# Patient Record
Sex: Female | Born: 1950 | Race: White | Hispanic: No | Marital: Married | Smoking: Never smoker
Health system: Southern US, Community
[De-identification: ages and names within clinical notes are randomized; demographics above are authoritative.]

## PROBLEM LIST (undated history)

## (undated) DIAGNOSIS — F329 Major depressive disorder, single episode, unspecified: Secondary | ICD-10-CM

## (undated) DIAGNOSIS — Z872 Personal history of diseases of the skin and subcutaneous tissue: Secondary | ICD-10-CM

## (undated) DIAGNOSIS — M797 Fibromyalgia: Secondary | ICD-10-CM

## (undated) DIAGNOSIS — I10 Essential (primary) hypertension: Secondary | ICD-10-CM

## (undated) DIAGNOSIS — K219 Gastro-esophageal reflux disease without esophagitis: Secondary | ICD-10-CM

## (undated) DIAGNOSIS — F32A Depression, unspecified: Secondary | ICD-10-CM

## (undated) DIAGNOSIS — M1990 Unspecified osteoarthritis, unspecified site: Secondary | ICD-10-CM

## (undated) DIAGNOSIS — Z8614 Personal history of Methicillin resistant Staphylococcus aureus infection: Secondary | ICD-10-CM

## (undated) DIAGNOSIS — Z87442 Personal history of urinary calculi: Secondary | ICD-10-CM

## (undated) DIAGNOSIS — M75100 Unspecified rotator cuff tear or rupture of unspecified shoulder, not specified as traumatic: Secondary | ICD-10-CM

## (undated) DIAGNOSIS — K5900 Constipation, unspecified: Secondary | ICD-10-CM

---

## 1984-02-15 HISTORY — PX: EXTERNAL EAR SURGERY: SHX627

## 1988-02-15 HISTORY — PX: ABDOMINAL HYSTERECTOMY: SHX81

## 2004-02-15 HISTORY — PX: JOINT REPLACEMENT: SHX530

## 2004-12-29 ENCOUNTER — Inpatient Hospital Stay (HOSPITAL_COMMUNITY): Admission: RE | Admit: 2004-12-29 | Discharge: 2005-01-01 | Payer: Self-pay | Admitting: Orthopedic Surgery

## 2005-02-14 HISTORY — PX: GASTRIC BYPASS: SHX52

## 2006-01-21 ENCOUNTER — Emergency Department: Payer: Self-pay | Admitting: Emergency Medicine

## 2007-05-16 ENCOUNTER — Ambulatory Visit: Payer: Self-pay | Admitting: Family Medicine

## 2008-08-19 ENCOUNTER — Ambulatory Visit: Payer: Self-pay | Admitting: Otolaryngology

## 2009-04-16 ENCOUNTER — Ambulatory Visit: Payer: Self-pay | Admitting: Family Medicine

## 2010-09-09 ENCOUNTER — Ambulatory Visit: Payer: Self-pay | Admitting: Family Medicine

## 2011-07-12 ENCOUNTER — Ambulatory Visit: Payer: Self-pay | Admitting: Family Medicine

## 2011-09-12 ENCOUNTER — Ambulatory Visit: Payer: Self-pay | Admitting: Family Medicine

## 2011-12-05 ENCOUNTER — Ambulatory Visit: Payer: Self-pay | Admitting: Gastroenterology

## 2012-09-13 ENCOUNTER — Other Ambulatory Visit: Payer: Self-pay | Admitting: Unknown Physician Specialty

## 2012-09-14 DIAGNOSIS — Z8614 Personal history of Methicillin resistant Staphylococcus aureus infection: Secondary | ICD-10-CM

## 2012-09-14 HISTORY — DX: Personal history of Methicillin resistant Staphylococcus aureus infection: Z86.14

## 2012-11-13 ENCOUNTER — Ambulatory Visit: Payer: Self-pay | Admitting: Family Medicine

## 2013-06-07 ENCOUNTER — Other Ambulatory Visit: Payer: Self-pay | Admitting: Orthopedic Surgery

## 2013-06-28 ENCOUNTER — Encounter (HOSPITAL_COMMUNITY): Payer: Self-pay | Admitting: Pharmacy Technician

## 2013-07-02 NOTE — Patient Instructions (Addendum)
Shelby MiloDeborah Marshall  07/02/2013                           YOUR PROCEDURE IS SCHEDULED ON: 07/11/13 AT 7:30 AM               ENTER THRU [facility] ENTRANCE AND                            FOLLOW  SIGNS TO SHORT STAY CENTER                 ARRIVE AT SHORT STAY AT:  5:30 AM               CALL THIS NUMBER IF ANY PROBLEMS THE DAY OF SURGERY :               [phone]                                REMEMBER:   Do not eat food or drink liquids AFTER MIDNIGHT               Take these medicines the morning of surgery with               A SIPS OF WATER :  PERCOCET IF NEEDED       Do not wear jewelry, make-up   Do not wear lotions, powders, or perfumes.   Do not shave legs or underarms 12 hrs. before surgery (men may shave face)  Do not bring valuables to the hospital.  Contacts, dentures or bridgework may not be worn into surgery.  Leave suitcase in the car. After surgery it may be brought to your room.  For patients admitted to the hospital more than one night, checkout time is            11:00 AM                                                       The day of discharge.   Patients discharged the day of surgery will not be allowed to drive home.            If going home same day of surgery, must have someone stay with you              FIRST 24 hrs at home and arrange for some one to drive you              home from hospital.   ________________________________________________________________________                                                                        [place] [facility] - PREPARING FOR SURGERY  Before surgery, you can play an important role.  Because skin is not sterile, your skin needs to be as free of germs as possible.  You can reduce the number of germs on your skin  by washing with CHG (chlorahexidine gluconate) soap before surgery.  CHG is an antiseptic cleaner which kills germs and bonds with the skin to continue killing germs even after  washing. Please DO NOT use if you have an allergy to CHG or antibacterial soaps.  If your skin becomes reddened/irritated stop using the CHG and inform your nurse when you arrive at Short Stay. Do not shave (including legs and underarms) for at least 48 hours prior to the first CHG shower.  You may shave your face. Please follow these instructions carefully:   1.  Shower with CHG Soap the night before surgery and the  morning of Surgery.   2.  If you choose to wash your hair, wash your hair first as usual with your  normal  Shampoo.   3.  After you shampoo, rinse your hair and body thoroughly to remove the  shampoo.                                         4.  Use CHG as you would any other liquid soap.  You can apply chg directly  to the skin and wash . Gently wash with scrungie or clean wascloth    5.  Apply the CHG Soap to your body ONLY FROM THE NECK DOWN.   Do not use on open                           Wound or open sores. Avoid contact with eyes, ears mouth and genitals (private parts).                        Genitals (private parts) with your normal soap.              6.  Wash thoroughly, paying special attention to the area where your surgery  will be performed.   7.  Thoroughly rinse your body with warm water from the neck down.   8.  DO NOT shower/wash with your normal soap after using and rinsing off  the CHG Soap .                9.  Pat yourself dry with a clean towel.             10.  Wear clean pajamas.             11.  Place clean sheets on your bed the night of your first shower and do not  sleep with pets.  Day of Surgery : Do not apply any lotions/deodorants the morning of surgery.  Please wear clean clothes to the hospital/surgery center.  FAILURE TO FOLLOW THESE INSTRUCTIONS MAY RESULT IN THE CANCELLATION OF YOUR SURGERY    PATIENT SIGNATURE_________________________________     Incentive Spirometer  An incentive spirometer is a tool that can help keep  your lungs clear and active. This tool measures how well you are filling your lungs with each breath. Taking long deep breaths may help reverse or decrease the chance of developing breathing (pulmonary) problems (especially infection) following:  A long period of time when you are unable to move or be active. BEFORE THE PROCEDURE   If the spirometer includes an indicator to show your best effort, your nurse or respiratory therapist will set it to a desired goal.  If  possible, sit up straight or lean slightly forward. Try not to slouch.  Hold the incentive spirometer in an upright position. INSTRUCTIONS FOR USE  1. Sit on the edge of your bed if possible, or sit up as far as you can in bed or on a chair. 2. Hold the incentive spirometer in an upright position. 3. Breathe out normally. 4. Place the mouthpiece in your mouth and seal your lips tightly around it. 5. Breathe in slowly and as deeply as possible, raising the piston or the ball toward the top of the column. 6. Hold your breath for 3-5 seconds or for as long as possible. Allow the piston or ball to fall to the bottom of the column. 7. Remove the mouthpiece from your mouth and breathe out normally. 8. Rest for a few seconds and repeat Steps 1 through 7 at least 10 times every 1-2 hours when you are awake. Take your time and take a few normal breaths between deep breaths. 9. The spirometer may include an indicator to show your best effort. Use the indicator as a goal to work toward during each repetition. 10. After each set of 10 deep breaths, practice coughing to be sure your lungs are clear. If you have an incision (the cut made at the time of surgery), support your incision when coughing by placing a pillow or rolled up towels firmly against it. Once you are able to get out of bed, walk around indoors and cough well. You may stop using the incentive spirometer when instructed by your caregiver.  RISKS AND COMPLICATIONS  Take your time  so you do not get dizzy or light-headed.  If you are in pain, you may need to take or ask for pain medication before doing incentive spirometry. It is harder to take a deep breath if you are having pain. AFTER USE  Rest and breathe slowly and easily.  It can be helpful to keep track of a log of your progress. Your caregiver can provide you with a simple table to help with this. If you are using the spirometer at home, follow these instructions: SEEK MEDICAL CARE IF:   You are having difficultly using the spirometer.  You have trouble using the spirometer as often as instructed.  Your pain medication is not giving enough relief while using the spirometer.  You develop fever of 100.5 F (38.1 C) or higher. SEEK IMMEDIATE MEDICAL CARE IF:   You cough up bloody sputum that had not been present before.  You develop fever of 102 F (38.9 C) or greater.  You develop worsening pain at or near the incision site. MAKE SURE YOU:   Understand these instructions.  Will watch your condition.  Will get help right away if you are not doing well or get worse. Document Released: 06/13/2006 Document Revised: 04/25/2011 Document Reviewed: 08/14/2006 [facility] Patient Information 2014 [place], [place].   ________________________________________________________________________

## 2013-07-03 ENCOUNTER — Other Ambulatory Visit: Payer: Self-pay | Admitting: Orthopedic Surgery

## 2013-07-03 ENCOUNTER — Encounter (HOSPITAL_COMMUNITY): Payer: Self-pay

## 2013-07-03 ENCOUNTER — Encounter (HOSPITAL_COMMUNITY)
Admission: RE | Admit: 2013-07-03 | Discharge: 2013-07-03 | Disposition: A | Discharge status: Home / Self Care | Payer: BC Managed Care – PPO | Source: Ambulatory Visit | Attending: Specialist | Admitting: Specialist

## 2013-07-03 ENCOUNTER — Ambulatory Visit (HOSPITAL_COMMUNITY)
Admission: RE | Admit: 2013-07-03 | Discharge: 2013-07-03 | Disposition: A | Discharge status: Home / Self Care | Payer: BC Managed Care – PPO | Source: Ambulatory Visit | Attending: Anesthesiology | Admitting: Anesthesiology

## 2013-07-03 DIAGNOSIS — K219 Gastro-esophageal reflux disease without esophagitis: Secondary | ICD-10-CM | POA: Insufficient documentation

## 2013-07-03 DIAGNOSIS — I10 Essential (primary) hypertension: Secondary | ICD-10-CM | POA: Insufficient documentation

## 2013-07-03 DIAGNOSIS — IMO0001 Reserved for inherently not codable concepts without codable children: Secondary | ICD-10-CM | POA: Insufficient documentation

## 2013-07-03 DIAGNOSIS — Z01818 Encounter for other preprocedural examination: Secondary | ICD-10-CM | POA: Insufficient documentation

## 2013-07-03 DIAGNOSIS — Z01812 Encounter for preprocedural laboratory examination: Secondary | ICD-10-CM | POA: Insufficient documentation

## 2013-07-03 DIAGNOSIS — Z87442 Personal history of urinary calculi: Secondary | ICD-10-CM | POA: Insufficient documentation

## 2013-07-03 DIAGNOSIS — Z01810 Encounter for preprocedural cardiovascular examination: Secondary | ICD-10-CM | POA: Insufficient documentation

## 2013-07-03 DIAGNOSIS — R918 Other nonspecific abnormal finding of lung field: Secondary | ICD-10-CM | POA: Insufficient documentation

## 2013-07-03 HISTORY — DX: Personal history of Methicillin resistant Staphylococcus aureus infection: Z86.14

## 2013-07-03 HISTORY — DX: Fibromyalgia: M79.7

## 2013-07-03 HISTORY — DX: Personal history of urinary calculi: Z87.442

## 2013-07-03 HISTORY — DX: Personal history of diseases of the skin and subcutaneous tissue: Z87.2

## 2013-07-03 HISTORY — DX: Essential (primary) hypertension: I10

## 2013-07-03 HISTORY — DX: Unspecified rotator cuff tear or rupture of unspecified shoulder, not specified as traumatic: M75.100

## 2013-07-03 HISTORY — DX: Unspecified osteoarthritis, unspecified site: M19.90

## 2013-07-03 HISTORY — DX: Constipation, unspecified: K59.00

## 2013-07-03 HISTORY — DX: Major depressive disorder, single episode, unspecified: F32.9

## 2013-07-03 HISTORY — DX: Gastro-esophageal reflux disease without esophagitis: K21.9

## 2013-07-03 HISTORY — DX: Depression, unspecified: F32.A

## 2013-07-03 LAB — BASIC METABOLIC PANEL
BUN: 28 mg/dL — ABNORMAL HIGH (ref 6–23)
CO2: 26 mEq/L (ref 19–32)
Calcium: 9.6 mg/dL (ref 8.4–10.5)
Chloride: 99 mEq/L (ref 96–112)
Creatinine, Ser: 0.8 mg/dL (ref 0.50–1.10)
GFR calc Af Amer: >90 mL/min (ref >90)
GFR calc non Af Amer: 79 mL/min — ABNORMAL LOW (ref >90)
Glucose, Bld: 97 mg/dL (ref 70–99)
Potassium: 4.3 mEq/L (ref 3.7–5.3)
Sodium: 138 mEq/L (ref 137–147)

## 2013-07-03 LAB — CBC
HCT: 37.2 % (ref 36.0–46.0)
Hemoglobin: 12.5 g/dL (ref 12.0–15.0)
MCH: 31.6 pg (ref 26.0–34.0)
MCHC: 33.6 g/dL (ref 30.0–36.0)
MCV: 93.9 fL (ref 78.0–100.0)
Platelets: 334 10*3/uL (ref 150–400)
RBC: 3.96 MIL/uL (ref 3.87–5.11)
RDW: 12.7 % (ref 11.5–15.5)
WBC: 7.5 10*3/uL (ref 4.0–10.5)

## 2013-07-03 NOTE — H&P (Signed)
Shelby Marshall is an 63 y.o. female.   Chief Complaint: R shoulder pain HPI: The patient is a 63 year old female who presents today for follow up of their shoulder. The patient is being followed for their right shoulder pain. They are 8 month(s) out from when symptoms began. Symptoms reported today include: pain. and report their pain level to be mild (to severe. The pain is worse at night). Current treatment includes: home exercise program, NSAIDs (Arthrotec) and heat. The patient presents today following MRI. The patient has reported improvement of their symptoms with: Cortisone injections (helped for one month).  Past Medical History  Diagnosis Date  . Hypertension   . Rotator cuff tear     RIGHT  . Fibromyalgia   . Arthritis   . Hx of psoriasis   . GERD (gastroesophageal reflux disease)   . Constipation   . History of kidney stones   . Depression   . History of MRSA infection 09/2012    Past Surgical History  Procedure Laterality Date  . Joint replacement  2006    L TOTAL HIP   . Gastric bypass  2007  . Abdominal hysterectomy  1990  . External ear surgery  1986    DEVELOPED STAPH INFECTION AFTER SURG    No family history on file. Social History:  reports that she has never smoked. She does not have any smokeless tobacco history on file. She reports that she drinks alcohol. She reports that she does not use illicit drugs.  Allergies:  Allergies  Allergen Reactions  . Morphine And Related Other (See Comments)    No results from pain     (Not in a hospital admission)  Results for orders placed during the hospital encounter of 07/03/13 (from the past 48 hour(s))  CBC     Status: None   Collection Time    07/03/13  3:10 PM      Result Value Ref Range   WBC 7.5  4.0 - 10.5 K/uL   RBC 3.96  3.87 - 5.11 MIL/uL   Hemoglobin 12.5  12.0 - 15.0 g/dL   HCT 37.2  36.0 - 46.0 %   MCV 93.9  78.0 - 100.0 fL   MCH 31.6  26.0 - 34.0 pg   MCHC 33.6  30.0 - 36.0 g/dL   RDW 12.7   11.5 - 15.5 %   Platelets 334  150 - 400 K/uL  BASIC METABOLIC PANEL     Status: Abnormal   Collection Time    07/03/13  3:10 PM      Result Value Ref Range   Sodium 138  137 - 147 mEq/L   Potassium 4.3  3.7 - 5.3 mEq/L   Chloride 99  96 - 112 mEq/L   CO2 26  19 - 32 mEq/L   Glucose, Bld 97  70 - 99 mg/dL   BUN 28 (*) 6 - 23 mg/dL   Creatinine, Ser 0.80  0.50 - 1.10 mg/dL   Calcium 9.6  8.4 - 10.5 mg/dL   GFR calc non Af Amer 79 (*) >90 mL/min   GFR calc Af Amer >90  >90 mL/min   Comment: (NOTE)     The eGFR has been calculated using the CKD EPI equation.     This calculation has not been validated in all clinical situations.     eGFR's persistently <90 mL/min signify possible Chronic Kidney     Disease.   Dg Chest 2 View  07/03/2013  CLINICAL DATA:  Preoperative evaluation for RIGHT rotator cuff repair, history hypertension, fibromyalgia, GERD, kidney stones  EXAM: CHEST  2 VIEW  COMPARISON:  12/28/2004  FINDINGS: Normal heart size, mediastinal contours, and pulmonary vascularity.  Minimal chronic peribronchial thickening.  Lungs otherwise clear.  No pleural effusion or pneumothorax.  Bones unremarkable.  IMPRESSION: Minimal chronic bronchitic changes.  No acute abnormalities.   Electronically Signed   By: Lavonia Dana M.D.   On: 07/03/2013 17:03    Review of Systems  Constitutional: Negative.   HENT: Negative.   Eyes: Negative.   Respiratory: Negative.   Cardiovascular: Negative.   Gastrointestinal: Negative.   Genitourinary: Negative.   Musculoskeletal: Positive for joint pain.  Skin: Negative.   Neurological: Negative.   Psychiatric/Behavioral: Negative.     There were no vitals taken for this visit. Physical Exam  Constitutional: She is oriented to person, place, and time. She appears well-developed and well-nourished.  HENT:  Head: Normocephalic and atraumatic.  Eyes: Conjunctivae and EOM are normal. Pupils are equal, round, and reactive to light.  Neck: Normal  range of motion. Neck supple.  Cardiovascular: Normal rate and regular rhythm.   Respiratory: Effort normal and breath sounds normal.  GI: Soft. Bowel sounds are normal.  Musculoskeletal:  On exam positive impingement signs, weakness with external rotation. Tender in the anterior subacromial region.  Inspection of the shoulder revealed no ecchymosis, soft tissue swelling, or deformity. On palpation, nontender over the [facility] region. On range of motion the patient had full range of motion. Provocative signs indicated no sulcus sign, negative speed's test. Negative lift off. Sensory exam was intact and motor function was normal in the deltoid and the rotator cuff.  Neurological: She is alert and oriented to person, place, and time. She has normal reflexes.  Skin: Skin is warm and dry.  Psychiatric: She has a normal mood and affect.     The patient has full thickness tear of the supraspinatus and infraspinatus 2 centimeters with 1 centimeter retraction, atrophy of the infraspinatus, and mild AC arthrosis. Interstitial tearing of the subscap.   Assessment/Plan R shoulder RCT Full thickness rotator cuff tear, retracted, mild atrophy of the infraspinatus.  I had a long discussion with the patient concerning the risks and benefits of the proposed shoulder surgery including need for rotator cuff repair, infection, suboptimal range of motion, adhesive capsulitis, and recurrent tear requiring further surgery. We also discussed the extended recovery requirement for postoperative physical therapy and the time to maximum recovery. I provided the patient with an illustrated handout and discussed that in detail. We also discussed anesthetic complications, DVT, PE, cardiopulmonary dysfunction, etc.   had a long discussion with the patient concerning the risks and benefits of a rotator cuff repair, including bleeding, infection, prolonged postoperative recovery, which may require 3 to 5 months until maximum  medical improvement. Overight procedure with initiation of early passive range of motion within physical therapy. Avoid any active motion for the first six weeks. This is all in an effort to avoid recurrent tear of the rotator cuff and adhesive capsulitis. Return to work without use of the arm can be obtained following two weeks. However, driving will be a challenge. We also discussed the possibility of requiring implants including bone anchors,as well as an Allograft patch graft if a massive rotator cuff tear is encountered. Removal of any bones for spurs as well as bursitis will be performed during the procedure and also any associated anesthetic complications as well.  Prescription for  Norco. History of MRSA exposure. She will require Vancomycin. Discussed time out of work and to recovery. She works at [employer].  Plan R shoulder mini-open RCR, SAD  Asta Corbridge M. Javonne Dorko PA-C for Dr. Tonita Cong 07/03/2013, 8:47 PM

## 2013-07-10 ENCOUNTER — Encounter (HOSPITAL_COMMUNITY): Payer: Self-pay | Admitting: Anesthesiology

## 2013-07-10 NOTE — Anesthesia Preprocedure Evaluation (Addendum)
Anesthesia Evaluation  Patient identified by MRN, date of birth, ID band Patient awake    Reviewed: Allergy & Precautions, H&P , NPO status , Patient's Chart, lab work & pertinent test results  Airway Mallampati: II TM Distance: >3 FB Neck ROM: Full    Dental no notable dental hx.    Pulmonary neg pulmonary ROS,  breath sounds clear to auscultation  Pulmonary exam normal       Cardiovascular Exercise Tolerance: Good hypertension, Pt. on medications Rhythm:Regular Rate:Normal  ECG normal CXR bronchitic changes   Neuro/Psych PSYCHIATRIC DISORDERS Depression  Neuromuscular disease    GI/Hepatic Neg liver ROS, GERD-  ,  Endo/Other  negative endocrine ROS  Renal/GU negative Renal ROS  negative genitourinary   Musculoskeletal  (+) Fibromyalgia -, narcotic dependent  Abdominal   Peds negative pediatric ROS (+)  Hematology negative hematology ROS (+)   Anesthesia Other Findings   Reproductive/Obstetrics negative OB ROS                          Anesthesia Physical Anesthesia Plan  ASA: II  Anesthesia Plan: General   Post-op Pain Management:    Induction: Intravenous  Airway Management Planned: Oral ETT  Additional Equipment:   Intra-op Plan:   Post-operative Plan: Extubation in OR  Informed Consent: I have reviewed the patients History and Physical, chart, labs and discussed the procedure including the risks, benefits and alternatives for the proposed anesthesia with the patient or authorized representative who has indicated his/her understanding and acceptance.   Dental advisory given  Plan Discussed with: CRNA  Anesthesia Plan Comments:         Anesthesia Quick Evaluation

## 2013-07-11 ENCOUNTER — Ambulatory Visit (HOSPITAL_COMMUNITY)
Admission: RE | Admit: 2013-07-11 | Discharge: 2013-07-11 | Disposition: A | Discharge status: Home / Self Care | Payer: BC Managed Care – PPO | Source: Ambulatory Visit | Attending: Specialist | Admitting: Specialist

## 2013-07-11 ENCOUNTER — Ambulatory Visit (HOSPITAL_COMMUNITY): Payer: BC Managed Care – PPO | Admitting: Anesthesiology

## 2013-07-11 ENCOUNTER — Encounter (HOSPITAL_COMMUNITY): Payer: BC Managed Care – PPO | Admitting: Anesthesiology

## 2013-07-11 ENCOUNTER — Encounter (HOSPITAL_COMMUNITY): Payer: Self-pay | Admitting: *Deleted

## 2013-07-11 ENCOUNTER — Encounter (HOSPITAL_COMMUNITY)
Admission: RE | Disposition: A | Discharge status: Home / Self Care | Payer: Self-pay | Source: Ambulatory Visit | Attending: Specialist

## 2013-07-11 DIAGNOSIS — X58XXXA Exposure to other specified factors, initial encounter: Secondary | ICD-10-CM | POA: Insufficient documentation

## 2013-07-11 DIAGNOSIS — Z8614 Personal history of Methicillin resistant Staphylococcus aureus infection: Secondary | ICD-10-CM | POA: Insufficient documentation

## 2013-07-11 DIAGNOSIS — K219 Gastro-esophageal reflux disease without esophagitis: Secondary | ICD-10-CM | POA: Insufficient documentation

## 2013-07-11 DIAGNOSIS — Z9884 Bariatric surgery status: Secondary | ICD-10-CM | POA: Insufficient documentation

## 2013-07-11 DIAGNOSIS — S43429A Sprain of unspecified rotator cuff capsule, initial encounter: Secondary | ICD-10-CM | POA: Insufficient documentation

## 2013-07-11 DIAGNOSIS — Z96649 Presence of unspecified artificial hip joint: Secondary | ICD-10-CM | POA: Insufficient documentation

## 2013-07-11 DIAGNOSIS — M75101 Unspecified rotator cuff tear or rupture of right shoulder, not specified as traumatic: Secondary | ICD-10-CM

## 2013-07-11 DIAGNOSIS — IMO0001 Reserved for inherently not codable concepts without codable children: Secondary | ICD-10-CM | POA: Insufficient documentation

## 2013-07-11 DIAGNOSIS — Z90710 Acquired absence of both cervix and uterus: Secondary | ICD-10-CM | POA: Insufficient documentation

## 2013-07-11 DIAGNOSIS — I10 Essential (primary) hypertension: Secondary | ICD-10-CM | POA: Insufficient documentation

## 2013-07-11 HISTORY — PX: SHOULDER ARTHROSCOPY WITH ROTATOR CUFF REPAIR AND SUBACROMIAL DECOMPRESSION: SHX5686

## 2013-07-11 SURGERY — SHOULDER ARTHROSCOPY WITH ROTATOR CUFF REPAIR AND SUBACROMIAL DECOMPRESSION
Anesthesia: General | Site: Shoulder | Laterality: Right

## 2013-07-11 MED ORDER — ONDANSETRON HCL 4 MG/2ML IJ SOLN
INTRAMUSCULAR | Status: DC | PRN
  Administered 2013-07-11: 4 mg via INTRAVENOUS

## 2013-07-11 MED ORDER — GLYCOPYRROLATE 0.2 MG/ML IJ SOLN
INTRAMUSCULAR | Status: AC
  Filled 2013-07-11: qty 3

## 2013-07-11 MED ORDER — ACETAMINOPHEN 10 MG/ML IV SOLN
1000.0000 mg | Freq: Once | INTRAVENOUS | Status: AC
  Administered 2013-07-11: 1000 mg via INTRAVENOUS
  Filled 2013-07-11: qty 100

## 2013-07-11 MED ORDER — BUPIVACAINE-EPINEPHRINE 0.5% -1:200000 IJ SOLN
INTRAMUSCULAR | Status: DC | PRN
  Administered 2013-07-11: 27 mL

## 2013-07-11 MED ORDER — KETOROLAC TROMETHAMINE 10 MG PO TABS: 10.0000 mg | ORAL_TABLET | Freq: Four times a day (QID) | ORAL | Status: AC | PRN

## 2013-07-11 MED ORDER — PHENYLEPHRINE HCL 10 MG/ML IJ SOLN
INTRAMUSCULAR | Status: AC
  Filled 2013-07-11: qty 1

## 2013-07-11 MED ORDER — LIDOCAINE HCL (CARDIAC) 20 MG/ML IV SOLN
INTRAVENOUS | Status: DC | PRN
  Administered 2013-07-11: 80 mg via INTRAVENOUS

## 2013-07-11 MED ORDER — LACTATED RINGERS IR SOLN
Status: DC | PRN
  Administered 2013-07-11: 3000 mL

## 2013-07-11 MED ORDER — FENTANYL CITRATE 0.05 MG/ML IJ SOLN
INTRAMUSCULAR | Status: DC | PRN
  Administered 2013-07-11 (×2): 50 ug via INTRAVENOUS
  Administered 2013-07-11: 25 ug via INTRAVENOUS
  Administered 2013-07-11: 50 ug via INTRAVENOUS
  Administered 2013-07-11: 25 ug via INTRAVENOUS
  Administered 2013-07-11: 50 ug via INTRAVENOUS

## 2013-07-11 MED ORDER — ROCURONIUM BROMIDE 100 MG/10ML IV SOLN
INTRAVENOUS | Status: DC | PRN
Start: 2013-07-11 — End: 2013-07-11
  Administered 2013-07-11: 50 mg via INTRAVENOUS

## 2013-07-11 MED ORDER — CYCLOBENZAPRINE HCL 10 MG PO TABS
10.0000 mg | ORAL_TABLET | Freq: Once | ORAL | Status: AC
  Administered 2013-07-11: 10 mg via ORAL
  Filled 2013-07-11: qty 1

## 2013-07-11 MED ORDER — CYCLOBENZAPRINE HCL 10 MG PO TABS: 10.0000 mg | ORAL_TABLET | Freq: Three times a day (TID) | ORAL | Status: AC | PRN

## 2013-07-11 MED ORDER — EPINEPHRINE HCL 1 MG/ML IJ SOLN
INTRAMUSCULAR | Status: AC
  Filled 2013-07-11: qty 2

## 2013-07-11 MED ORDER — KETAMINE HCL 10 MG/ML IJ SOLN
INTRAMUSCULAR | Status: AC
  Filled 2013-07-11: qty 1

## 2013-07-11 MED ORDER — DOXYCYCLINE HYCLATE 100 MG PO CAPS: 100.0000 mg | ORAL_CAPSULE | Freq: Two times a day (BID) | ORAL | Status: AC

## 2013-07-11 MED ORDER — FENTANYL CITRATE 0.05 MG/ML IJ SOLN
25.0000 ug | INTRAMUSCULAR | Status: DC | PRN
  Administered 2013-07-11 (×2): 50 ug via INTRAVENOUS

## 2013-07-11 MED ORDER — LACTATED RINGERS IV SOLN
INTRAVENOUS | Status: DC
Start: 2013-07-11 — End: 2013-07-11

## 2013-07-11 MED ORDER — CEFAZOLIN SODIUM-DEXTROSE 2-3 GM-% IV SOLR
INTRAVENOUS | Status: AC
  Filled 2013-07-11: qty 50

## 2013-07-11 MED ORDER — NEOSTIGMINE METHYLSULFATE 10 MG/10ML IV SOLN
INTRAVENOUS | Status: AC
  Filled 2013-07-11: qty 1

## 2013-07-11 MED ORDER — FENTANYL CITRATE 0.05 MG/ML IJ SOLN
INTRAMUSCULAR | Status: AC
  Filled 2013-07-11: qty 2

## 2013-07-11 MED ORDER — ONDANSETRON HCL 4 MG/2ML IJ SOLN
INTRAMUSCULAR | Status: AC
  Filled 2013-07-11: qty 2

## 2013-07-11 MED ORDER — BUPIVACAINE-EPINEPHRINE (PF) 0.5% -1:200000 IJ SOLN
INTRAMUSCULAR | Status: AC
  Filled 2013-07-11: qty 30

## 2013-07-11 MED ORDER — MIDAZOLAM HCL 5 MG/5ML IJ SOLN
INTRAMUSCULAR | Status: DC | PRN
  Administered 2013-07-11: 2 mg via INTRAVENOUS

## 2013-07-11 MED ORDER — EPINEPHRINE HCL 1 MG/ML IJ SOLN
INTRAMUSCULAR | Status: DC | PRN
  Administered 2013-07-11: 1 mg

## 2013-07-11 MED ORDER — DEXAMETHASONE SODIUM PHOSPHATE 10 MG/ML IJ SOLN
INTRAMUSCULAR | Status: AC
  Filled 2013-07-11: qty 1

## 2013-07-11 MED ORDER — MIDAZOLAM HCL 2 MG/2ML IJ SOLN
INTRAMUSCULAR | Status: AC
  Filled 2013-07-11: qty 2

## 2013-07-11 MED ORDER — KETOROLAC TROMETHAMINE 10 MG PO TABS
10.0000 mg | ORAL_TABLET | Freq: Once | ORAL | Status: AC
  Administered 2013-07-11: 10 mg via ORAL
  Filled 2013-07-11: qty 1

## 2013-07-11 MED ORDER — VANCOMYCIN HCL IN DEXTROSE 1-5 GM/200ML-% IV SOLN
INTRAVENOUS | Status: AC
  Filled 2013-07-11: qty 200

## 2013-07-11 MED ORDER — LIDOCAINE HCL (CARDIAC) 20 MG/ML IV SOLN
INTRAVENOUS | Status: AC
  Filled 2013-07-11: qty 5

## 2013-07-11 MED ORDER — SODIUM CHLORIDE 0.9 % IR SOLN
Status: DC | PRN
  Administered 2013-07-11: 09:00:00

## 2013-07-11 MED ORDER — PROPOFOL 10 MG/ML IV BOLUS
INTRAVENOUS | Status: AC
  Filled 2013-07-11: qty 20

## 2013-07-11 MED ORDER — NEOSTIGMINE METHYLSULFATE 10 MG/10ML IV SOLN
INTRAVENOUS | Status: DC | PRN
  Administered 2013-07-11: 3 mg via INTRAVENOUS

## 2013-07-11 MED ORDER — CEFAZOLIN SODIUM-DEXTROSE 2-3 GM-% IV SOLR
2.0000 g | INTRAVENOUS | Status: AC
  Administered 2013-07-11: 2 g via INTRAVENOUS

## 2013-07-11 MED ORDER — DOCUSATE SODIUM 100 MG PO CAPS: 100.0000 mg | ORAL_CAPSULE | Freq: Two times a day (BID) | ORAL | Status: AC | PRN

## 2013-07-11 MED ORDER — DEXAMETHASONE SODIUM PHOSPHATE 10 MG/ML IJ SOLN
INTRAMUSCULAR | Status: DC | PRN
  Administered 2013-07-11: 10 mg via INTRAVENOUS

## 2013-07-11 MED ORDER — PROMETHAZINE HCL 25 MG/ML IJ SOLN: 6.2500 mg | INTRAMUSCULAR | Status: DC | PRN

## 2013-07-11 MED ORDER — LACTATED RINGERS IV SOLN
INTRAVENOUS | Status: DC | PRN
  Administered 2013-07-11 (×2): via INTRAVENOUS

## 2013-07-11 MED ORDER — VANCOMYCIN HCL IN DEXTROSE 1-5 GM/200ML-% IV SOLN
1000.0000 mg | INTRAVENOUS | Status: AC
  Administered 2013-07-11: 1000 mg via INTRAVENOUS

## 2013-07-11 MED ORDER — FENTANYL CITRATE 0.05 MG/ML IJ SOLN
INTRAMUSCULAR | Status: AC
  Filled 2013-07-11: qty 5

## 2013-07-11 MED ORDER — OXYCODONE-ACETAMINOPHEN 7.5-325 MG PO TABS: 1.0000 | ORAL_TABLET | ORAL | Status: AC | PRN

## 2013-07-11 MED ORDER — ETOMIDATE 2 MG/ML IV SOLN
INTRAVENOUS | Status: AC
  Filled 2013-07-11: qty 10

## 2013-07-11 MED ORDER — GLYCOPYRROLATE 0.2 MG/ML IJ SOLN
INTRAMUSCULAR | Status: DC | PRN
  Administered 2013-07-11: .4 mg via INTRAVENOUS

## 2013-07-11 MED ORDER — ROCURONIUM BROMIDE 100 MG/10ML IV SOLN
INTRAVENOUS | Status: AC
  Filled 2013-07-11: qty 1

## 2013-07-11 SURGICAL SUPPLY — 57 items
ANCH SUT PUSHLCK 24X4.5 STRL (Orthopedic Implant) ×2 IMPLANT
ANCH SUT SWLK 19.1X4.75 VT (Anchor) ×1 IMPLANT
ANCHOR NEEDLE 9/16 CIR SZ 8 (NEEDLE) IMPLANT
ANCHOR PEEK 4.75X19.1 SWLK C (Anchor) ×2 IMPLANT
APL SKNCLS STERI-STRIP NONHPOA (GAUZE/BANDAGES/DRESSINGS) ×1
BENZOIN TINCTURE PRP APPL 2/3 (GAUZE/BANDAGES/DRESSINGS) ×2 IMPLANT
BLADE CUDA SHAVER 3.5 (BLADE) ×2 IMPLANT
BLADE OSCILLATING/SAGITTAL (BLADE)
BLADE SURG SZ11 CARB STEEL (BLADE) ×2 IMPLANT
BLADE SW THK.38XMED LNG THN (BLADE) IMPLANT
BUR OVAL 4.0 (BURR) IMPLANT
CANNULA ACUFO 5X76 (CANNULA) ×2 IMPLANT
CHLORAPREP W/TINT 26ML (MISCELLANEOUS) IMPLANT
CLEANER TIP ELECTROSURG 2X2 (MISCELLANEOUS) ×2 IMPLANT
CLOTH 2% CHLOROHEXIDINE 3PK (PERSONAL CARE ITEMS) ×2 IMPLANT
DECANTER SPIKE VIAL GLASS SM (MISCELLANEOUS) ×2 IMPLANT
DRAPE ORTHO SPLIT 77X108 STRL (DRAPES) ×2
DRAPE POUCH INSTRU U-SHP 10X18 (DRAPES) IMPLANT
DRAPE STERI 35X30 U-POUCH (DRAPES) ×2 IMPLANT
DRAPE SURG ORHT 6 SPLT 77X108 (DRAPES) ×1 IMPLANT
DRSG AQUACEL AG ADV 3.5X 6 (GAUZE/BANDAGES/DRESSINGS) ×2 IMPLANT
DURAPREP 26ML APPLICATOR (WOUND CARE) ×2 IMPLANT
ELECT NEEDLE TIP 2.8 STRL (NEEDLE) IMPLANT
ELECT REM PT RETURN 9FT ADLT (ELECTROSURGICAL) ×2
ELECTRODE REM PT RTRN 9FT ADLT (ELECTROSURGICAL) ×1 IMPLANT
GLOVE BIOGEL PI IND STRL 7.5 (GLOVE) ×1 IMPLANT
GLOVE BIOGEL PI IND STRL 8 (GLOVE) ×1 IMPLANT
GLOVE BIOGEL PI INDICATOR 7.5 (GLOVE) ×1
GLOVE BIOGEL PI INDICATOR 8 (GLOVE) ×1
GLOVE SURG SS PI 7.5 STRL IVOR (GLOVE) ×2 IMPLANT
GLOVE SURG SS PI 8.0 STRL IVOR (GLOVE) ×4 IMPLANT
GOWN STRL REUS W/TWL XL LVL3 (GOWN DISPOSABLE) ×4 IMPLANT
KIT BASIN OR (CUSTOM PROCEDURE TRAY) ×2 IMPLANT
KIT POSITION SHOULDER SCHLEI (MISCELLANEOUS) ×2 IMPLANT
MANIFOLD NEPTUNE II (INSTRUMENTS) ×2 IMPLANT
NEEDLE SCORPION MULTI FIRE (NEEDLE) ×2 IMPLANT
NEEDLE SPNL 18GX3.5 QUINCKE PK (NEEDLE) ×2 IMPLANT
PACK SHOULDER CUSTOM OPM052 (CUSTOM PROCEDURE TRAY) ×2 IMPLANT
POSITIONER SURGICAL ARM (MISCELLANEOUS) ×2 IMPLANT
PUSHLOCK PEEK 4.5X24 (Orthopedic Implant) ×4 IMPLANT
SET ARTHROSCOPY TUBING (MISCELLANEOUS) ×2
SET ARTHROSCOPY TUBING LN (MISCELLANEOUS) ×1 IMPLANT
SLING ARM IMMOBILIZER LRG (SOFTGOODS) IMPLANT
SLING ARM IMMOBILIZER MED (SOFTGOODS) ×2 IMPLANT
SLING ULTRA II L (ORTHOPEDIC SUPPLIES) IMPLANT
STRIP CLOSURE SKIN 1/2X4 (GAUZE/BANDAGES/DRESSINGS) ×2 IMPLANT
SUT ETHIBOND NAB CT1 #1 30IN (SUTURE) IMPLANT
SUT ETHILON 4 0 PS 2 18 (SUTURE) IMPLANT
SUT PROLENE 3 0 PS 2 (SUTURE) ×2 IMPLANT
SUT TIGER TAPE 7 IN WHITE (SUTURE) IMPLANT
SUT VIC AB 1-0 CT2 27 (SUTURE) ×2 IMPLANT
SUT VIC AB 2-0 CT2 27 (SUTURE) ×2 IMPLANT
SUT VICRYL 0-0 OS 2 NEEDLE (SUTURE) IMPLANT
TAPE FIBER 2MM 7IN #2 BLUE (SUTURE) ×2 IMPLANT
TOWEL OR 17X26 10 PK STRL BLUE (TOWEL DISPOSABLE) ×2 IMPLANT
TUBING CONNECTING 10 (TUBING) ×2 IMPLANT
WAND 90 DEG TURBOVAC W/CORD (SURGICAL WAND) IMPLANT

## 2013-07-11 NOTE — Interval H&P Note (Signed)
History and Physical Interval Note:  07/11/2013 7:16 AM  Shelby Marshall  has presented today for surgery, with the diagnosis of RIGHT ROTATOR CUFF TEAR  The various methods of treatment have been discussed with the patient and family. After consideration of risks, benefits and other options for treatment, the patient has consented to  Procedure(s): RIGHT SHOULDER ARTHROSCOPY SUBACROMIAL DECOMPRESSION  WITH  MINI OPEN ROTATOR CUFF REPAIR  (Right) as a surgical intervention .  The patient's history has been reviewed, patient examined, no change in status, stable for surgery.  I have reviewed the patient's chart and labs.  Questions were answered to the patient's satisfaction.     Javier Docker

## 2013-07-11 NOTE — Discharge Instructions (Signed)
Aquacel dressing may remain in place x 7 days. After 7 days, remove aquacel dressing, leave steri strips in place, and place a new gauze and tape dressing which should be kept clean and dry and changed daily. Use sling at times except when exercising or showering No driving for 4-6 weeks No lifting for 6 weeks operative arm Pendulum exercises as instructed. Ok to move wrist,elbow, and hand. See Dr. Shelle Iron in 10-14 days. Take one aspirin per day with a meal if not on a blood thinner or allergic to aspirin.

## 2013-07-11 NOTE — Anesthesia Postprocedure Evaluation (Signed)
  Anesthesia Post-op Note  Patient: Shelby Marshall  Procedure(s) Performed: Procedure(s) (LRB): RIGHT SHOULDER ARTHROSCOPY SUBACROMIAL DECOMPRESSION  WITH  MINI OPEN ROTATOR CUFF REPAIR  (Right)  Patient Location: PACU  Anesthesia Type: General  Level of Consciousness: awake and alert   Airway and Oxygen Therapy: Patient Spontanous Breathing  Post-op Pain: mild  Post-op Assessment: Post-op Vital signs reviewed, Patient's Cardiovascular Status Stable, Respiratory Function Stable, Patent Airway and No signs of Nausea or vomiting  Last Vitals:  Filed Vitals:   07/11/13 1204  BP: 121/79  Pulse: 68  Temp: 36.2 C  Resp: 16    Post-op Vital Signs: stable   Complications: No apparent anesthesia complications

## 2013-07-11 NOTE — H&P (View-Only) (Signed)
Shelby Marshall is an 63 y.o. female.   Chief Complaint: R shoulder pain HPI: The patient is a 63 year old female who presents today for follow up of their shoulder. The patient is being followed for their right shoulder pain. They are 8 month(s) out from when symptoms began. Symptoms reported today include: pain. and report their pain level to be mild (to severe. The pain is worse at night). Current treatment includes: home exercise program, NSAIDs (Arthrotec) and heat. The patient presents today following MRI. The patient has reported improvement of their symptoms with: Cortisone injections (helped for one month).  Past Medical History  Diagnosis Date  . Hypertension   . Rotator cuff tear     RIGHT  . Fibromyalgia   . Arthritis   . Hx of psoriasis   . GERD (gastroesophageal reflux disease)   . Constipation   . History of kidney stones   . Depression   . History of MRSA infection 09/2012    Past Surgical History  Procedure Laterality Date  . Joint replacement  2006    L TOTAL HIP   . Gastric bypass  2007  . Abdominal hysterectomy  1990  . External ear surgery  1986    DEVELOPED STAPH INFECTION AFTER SURG    No family history on file. Social History:  reports that she has never smoked. She does not have any smokeless tobacco history on file. She reports that she drinks alcohol. She reports that she does not use illicit drugs.  Allergies:  Allergies  Allergen Reactions  . Morphine And Related Other (See Comments)    No results from pain     (Not in a hospital admission)  Results for orders placed during the hospital encounter of 07/03/13 (from the past 48 hour(s))  CBC     Status: None   Collection Time    07/03/13  3:10 PM      Result Value Ref Range   WBC 7.5  4.0 - 10.5 K/uL   RBC 3.96  3.87 - 5.11 MIL/uL   Hemoglobin 12.5  12.0 - 15.0 g/dL   HCT 37.2  36.0 - 46.0 %   MCV 93.9  78.0 - 100.0 fL   MCH 31.6  26.0 - 34.0 pg   MCHC 33.6  30.0 - 36.0 g/dL   RDW 12.7   11.5 - 15.5 %   Platelets 334  150 - 400 K/uL  BASIC METABOLIC PANEL     Status: Abnormal   Collection Time    07/03/13  3:10 PM      Result Value Ref Range   Sodium 138  137 - 147 mEq/L   Potassium 4.3  3.7 - 5.3 mEq/L   Chloride 99  96 - 112 mEq/L   CO2 26  19 - 32 mEq/L   Glucose, Bld 97  70 - 99 mg/dL   BUN 28 (*) 6 - 23 mg/dL   Creatinine, Ser 0.80  0.50 - 1.10 mg/dL   Calcium 9.6  8.4 - 10.5 mg/dL   GFR calc non Af Amer 79 (*) >90 mL/min   GFR calc Af Amer >90  >90 mL/min   Comment: (NOTE)     The eGFR has been calculated using the CKD EPI equation.     This calculation has not been validated in all clinical situations.     eGFR's persistently <90 mL/min signify possible Chronic Kidney     Disease.   Dg Chest 2 View  07/03/2013  CLINICAL DATA:  Preoperative evaluation for RIGHT rotator cuff repair, history hypertension, fibromyalgia, GERD, kidney stones  EXAM: CHEST  2 VIEW  COMPARISON:  12/28/2004  FINDINGS: Normal heart size, mediastinal contours, and pulmonary vascularity.  Minimal chronic peribronchial thickening.  Lungs otherwise clear.  No pleural effusion or pneumothorax.  Bones unremarkable.  IMPRESSION: Minimal chronic bronchitic changes.  No acute abnormalities.   Electronically Signed   By: Lavonia Dana M.D.   On: 07/03/2013 17:03    Review of Systems  Constitutional: Negative.   HENT: Negative.   Eyes: Negative.   Respiratory: Negative.   Cardiovascular: Negative.   Gastrointestinal: Negative.   Genitourinary: Negative.   Musculoskeletal: Positive for joint pain.  Skin: Negative.   Neurological: Negative.   Psychiatric/Behavioral: Negative.     There were no vitals taken for this visit. Physical Exam  Constitutional: She is oriented to person, place, and time. She appears well-developed and well-nourished.  HENT:  Head: Normocephalic and atraumatic.  Eyes: Conjunctivae and EOM are normal. Pupils are equal, round, and reactive to light.  Neck: Normal  range of motion. Neck supple.  Cardiovascular: Normal rate and regular rhythm.   Respiratory: Effort normal and breath sounds normal.  GI: Soft. Bowel sounds are normal.  Musculoskeletal:  On exam positive impingement signs, weakness with external rotation. Tender in the anterior subacromial region.  Inspection of the shoulder revealed no ecchymosis, soft tissue swelling, or deformity. On palpation, nontender over the [facility] region. On range of motion the patient had full range of motion. Provocative signs indicated no sulcus sign, negative speed's test. Negative lift off. Sensory exam was intact and motor function was normal in the deltoid and the rotator cuff.  Neurological: She is alert and oriented to person, place, and time. She has normal reflexes.  Skin: Skin is warm and dry.  Psychiatric: She has a normal mood and affect.     The patient has full thickness tear of the supraspinatus and infraspinatus 2 centimeters with 1 centimeter retraction, atrophy of the infraspinatus, and mild AC arthrosis. Interstitial tearing of the subscap.   Assessment/Plan R shoulder RCT Full thickness rotator cuff tear, retracted, mild atrophy of the infraspinatus.  I had a long discussion with the patient concerning the risks and benefits of the proposed shoulder surgery including need for rotator cuff repair, infection, suboptimal range of motion, adhesive capsulitis, and recurrent tear requiring further surgery. We also discussed the extended recovery requirement for postoperative physical therapy and the time to maximum recovery. I provided the patient with an illustrated handout and discussed that in detail. We also discussed anesthetic complications, DVT, PE, cardiopulmonary dysfunction, etc.   had a long discussion with the patient concerning the risks and benefits of a rotator cuff repair, including bleeding, infection, prolonged postoperative recovery, which may require 3 to 5 months until maximum  medical improvement. Overight procedure with initiation of early passive range of motion within physical therapy. Avoid any active motion for the first six weeks. This is all in an effort to avoid recurrent tear of the rotator cuff and adhesive capsulitis. Return to work without use of the arm can be obtained following two weeks. However, driving will be a challenge. We also discussed the possibility of requiring implants including bone anchors,as well as an Allograft patch graft if a massive rotator cuff tear is encountered. Removal of any bones for spurs as well as bursitis will be performed during the procedure and also any associated anesthetic complications as well.  Prescription for  Norco. History of MRSA exposure. She will require Vancomycin. Discussed time out of work and to recovery. She works at [employer].  Plan R shoulder mini-open RCR, SAD  Jaclyn M. Bissell PA-C for Dr. Tonita Cong 07/03/2013, 8:47 PM

## 2013-07-11 NOTE — Brief Op Note (Signed)
07/11/2013  9:11 AM  PATIENT:  Shelby Marshall  63 y.o. female  PRE-OPERATIVE DIAGNOSIS:  RIGHT ROTATOR CUFF TEAR  POST-OPERATIVE DIAGNOSIS:  RIGHT ROTATOR CUFF TEAR  PROCEDURE:  Procedure(s): RIGHT SHOULDER ARTHROSCOPY SUBACROMIAL DECOMPRESSION  WITH  MINI OPEN ROTATOR CUFF REPAIR  (Right)  SURGEON:  Surgeon(s) and Role:    * Javier Docker, MD - Primary  PHYSICIAN ASSISTANT:   ASSISTANTS: Bissell   ANESTHESIA:   general  EBL:  Total I/O In: 1000 [I.V.:1000] Out: -   BLOOD ADMINISTERED:none  DRAINS: none   LOCAL MEDICATIONS USED:  MARCAINE     SPECIMEN:  No Specimen  DISPOSITION OF SPECIMEN:  N/A  COUNTS:  YES  TOURNIQUET:  * No tourniquets in log *  DICTATION: .Other Dictation: Dictation Number [id number]  PLAN OF CARE: Discharge to home after PACU  PATIENT DISPOSITION:  PACU - hemodynamically stable.   Delay start of Pharmacological VTE agent (>24hrs) due to surgical blood loss or risk of bleeding: no

## 2013-07-11 NOTE — Progress Notes (Signed)
Called Dr Shelle Iron to clarify the doxycycline refill for right surgery post op. He states it is for any future surgery. Relayed this to patient.

## 2013-07-11 NOTE — Transfer of Care (Signed)
Immediate Anesthesia Transfer of Care Note  Patient: Shelby Marshall  Procedure(s) Performed: Procedure(s): RIGHT SHOULDER ARTHROSCOPY SUBACROMIAL DECOMPRESSION  WITH  MINI OPEN ROTATOR CUFF REPAIR  (Right)  Patient Location: PACU  Anesthesia Type:General  Level of Consciousness: Patient easily awoken, sedated, comfortable, cooperative, following commands, responds to stimulation.   Airway & Oxygen Therapy: Patient spontaneously breathing, ventilating well, oxygen via simple oxygen mask.  Post-op Assessment: Report given to PACU RN, vital signs reviewed and stable, moving all extremities.   Post vital signs: Reviewed and stable.  Complications: No apparent anesthesia complications

## 2013-07-11 NOTE — Op Note (Signed)
Shelby Marshall, Shelby Marshall              ACCOUNT NO.:  [id number]  MEDICAL RECORD NO.:  [id number]  LOCATION:  [facility]                         FACILITY:  [facility]  PHYSICIAN:  Jene Every, M.D.    DATE OF BIRTH:  11/12/1950  DATE OF PROCEDURE:  07/11/2013 DATE OF DISCHARGE:  07/11/2013                              OPERATIVE REPORT   PREOPERATIVE DIAGNOSIS:  Rotator cuff tear, full-thickness, retracted, right shoulder.  POSTOPERATIVE DIAGNOSES:  Rotator cuff tear, full-thickness, retracted, right shoulder, and impingement syndrome.  PROCEDURES PERFORMED: 1. Exam under anesthesia. 2. Right shoulder arthroscopy with subacromial decompression,     bursectomy. 3. Mini open rotator cuff repair utilizing PushLock Arthrex suture     anchors.  ANESTHESIA:  General.  ASSISTANT:  Lanna Poche, PA, was utilized throughout the case for traction of the upper extremity, intermittent, closure, patient positioning.  BRIEF HISTORY:  A 63 year old, full-thickness, retracted tear of the rotator cuff, supraspinatus portion, the infraspinatus indicated for repair.  Risk and benefits were discussed including bleeding, infection, suboptimal range of motion, protracted recovery, loss of range of motion, etc.  TECHNIQUE:  With the patient supine in beach-chair position, after the induction of adequate general anesthesia, 2 g of Kefzol and 1 g of vancomycin, due to her history of MRSA infection, the right shoulder and upper extremity was prepped and draped in usual sterile fashion.  She had an exam under anesthesia.  She had full range.  Surgical marker was utilized to delineate the acromion, acromioclavicular joint, and coracoid.  Standard posterolateral and anterolateral portals were utilized with incision through the skin only with a #11 blade.  With the arm in the 70-30 position, gentle traction applied and we advanced the arthroscopic cannula into the glenohumeral joint penetrating the capsule  atraumatically, this was in line with the coracoid, irrigated, 65 mmHg was utilized to insufflate the joint.  The joint was lavaged.  Inspection revealed normal glenoid, normal humeral head, rotator cuff tear attachment lateral to the bicipital groove was noted.  Biceps was intact and within its groove and stable.  Subscap was unremarkable.  Labrum was without evidence of tear.  Significant tear of the rotator cuff was noted.  After thorough lavage, we redirected the camera in the subacromial space, triangulating with an anterolateral portal cannula in the subacromial space.  Hypertrophic bursa was noted. We introduced a 3.5 shaver and performed a bursectomy.  She had ample space in the subacromial region.  After the bursectomy, again full- thickness tear was confirmed.  I decided to convert it to a mini open rotator cuff repair.  All arthroscopic equipment was removed and then the portals were closed with 4-0 nylon simple suture.  A small 2-cm incision over the anterior lateral aspect of the acromion was made through the skin.  Subcutaneous tissue was dissected.  Electrocautery was utilized to achieve hemostasis.  The raphe between the anterior and lateral heads of the deltoid was divided in line with the skin incision. Self-retaining retractor was placed.  Full-thickness tear identified. We used a 3-mm Kerrison and removed a small spur off the anterolateral aspect of the acromion and CA ligament excised.  I fashioned the cancellous bed lateral to the articular  surface in the greater tuberosity.  The edges of the rotator cuff tear was retracted approximately 1 cm.  We mobilized the rotator cuff on both surfaces and it advanced without difficulty into the cancellous trough.  I placed a swivel lock into the cancellous bed after utilization of an awl, insertion of the lock with excellent resistance to pull up.  We used fiber tape, one direct in the supraspinatus, the other in  the infraspinatus with rescue sutures as well.  These were crossed over the greater tuberosity.  Advanced the tendon into the bed and used an awl and placed two swivel locks over the greater tuberosity, used an awl to its slightly soft bone.  We threaded the sutures with the appropriate tension into the swivel locks over the tuberosity, inserting it with excellent resistance to pull up.  Full coverage was noted and a secured double-row fashion repair.  Wound was copiously irrigated.  Full coverage was noted.  No residual pathology.  Repaired the raphe with #1 Vicryl, subcu with 2-0 Vicryl, skin with subcuticular Prolene, subacromial Marcaine was injected.  Wound was dressed sterilely.  Placed in a sling, extubated without difficulty, and transported to the recovery room in satisfactory condition.  The patient tolerated the procedure well.  No complications.  Assistant, Lanna PocheJacqueline Bissell, [place].  Minimal blood loss.     Jene EveryJeffrey Nishtha Raider, M.D.     Cordelia PenJB/MEDQ  D:  07/11/2013  T:  07/11/2013  Job:  [id number]

## 2013-07-12 ENCOUNTER — Encounter (HOSPITAL_COMMUNITY): Payer: Self-pay | Admitting: Specialist

## 2014-04-29 IMAGING — US US RENAL KIDNEY
1 series · 14 of 25 positions shown · non-contrast
Comparison: none

REASON FOR EXAM: STAT CR [id number] hematuria dysuria eval for kidney stone
COMMENTS:

[Series 1: us renal kidney · 0.28mm/px · 14 of 43 slices shown]
[im 1/43]
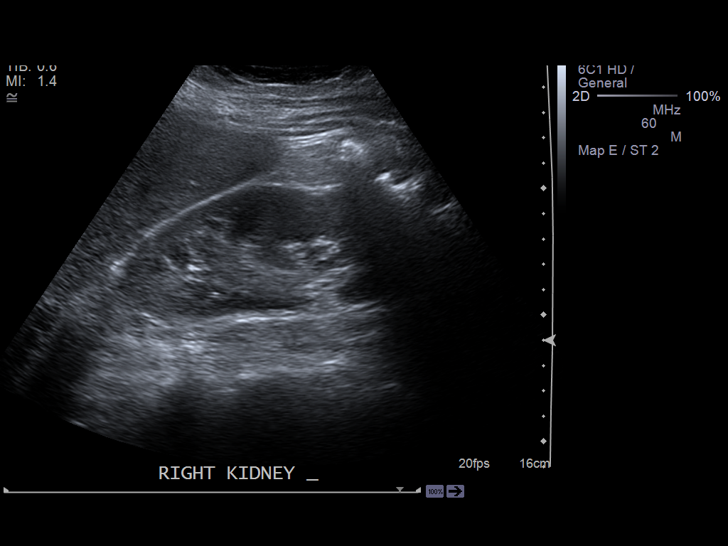
[im 4/43]
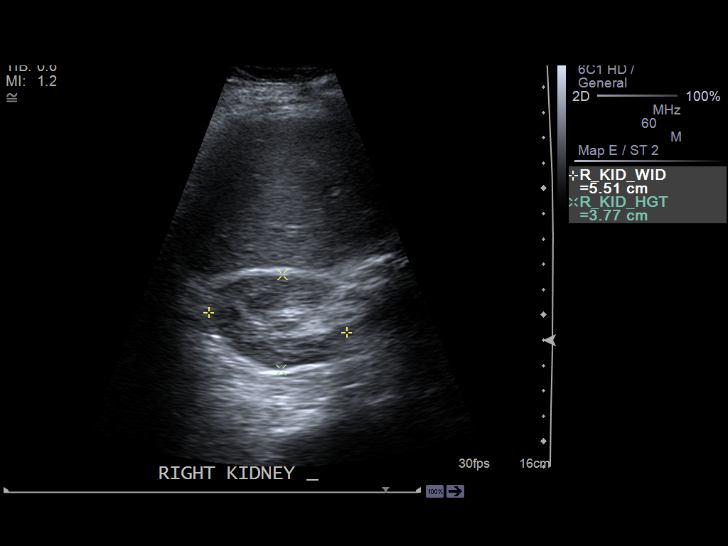
[im 8/43]
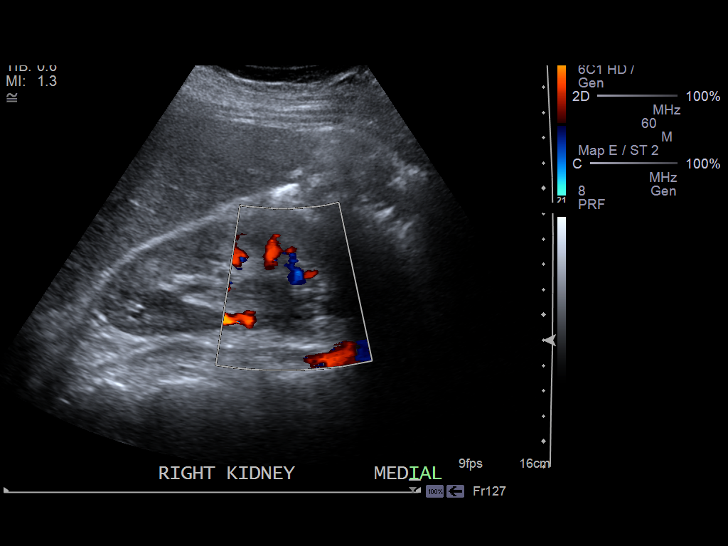
[im 11/43]
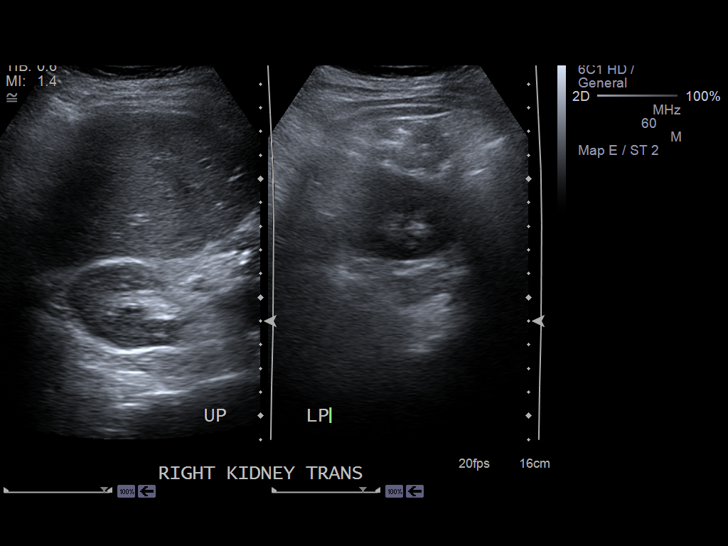
[im 15/43]
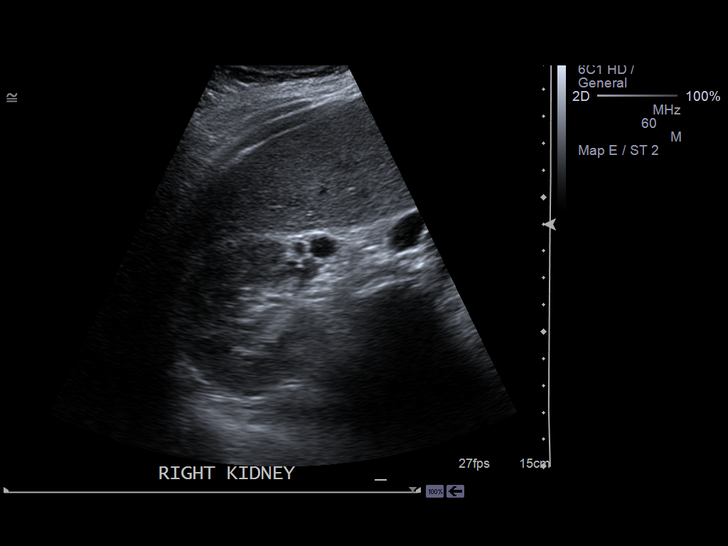
[im 16/43]
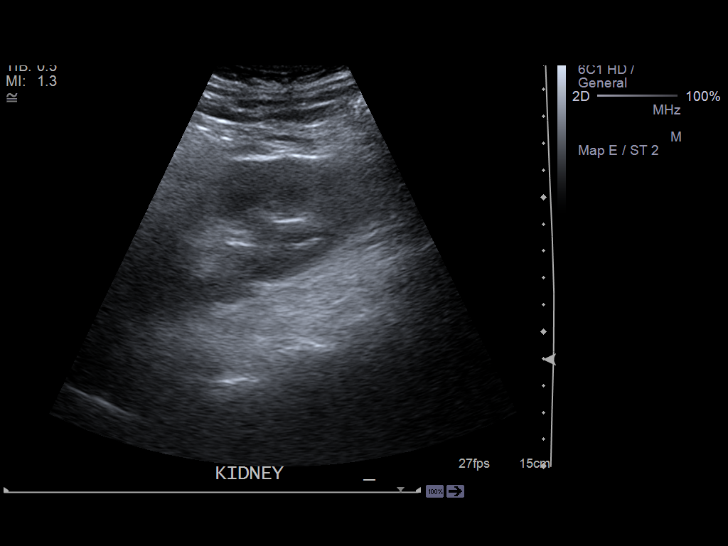
[im 20/43]
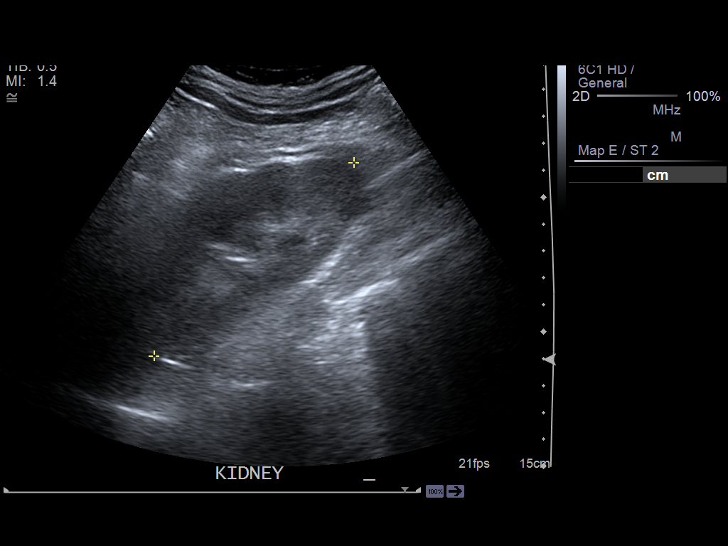
[im 23/43]
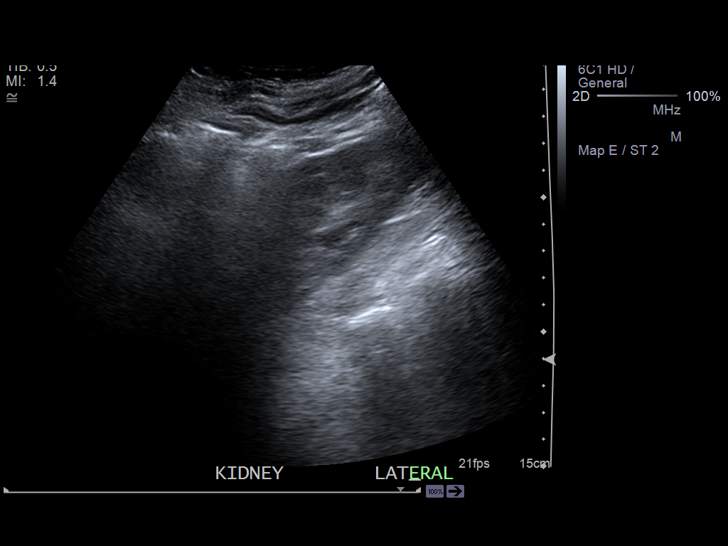
[im 27/43]
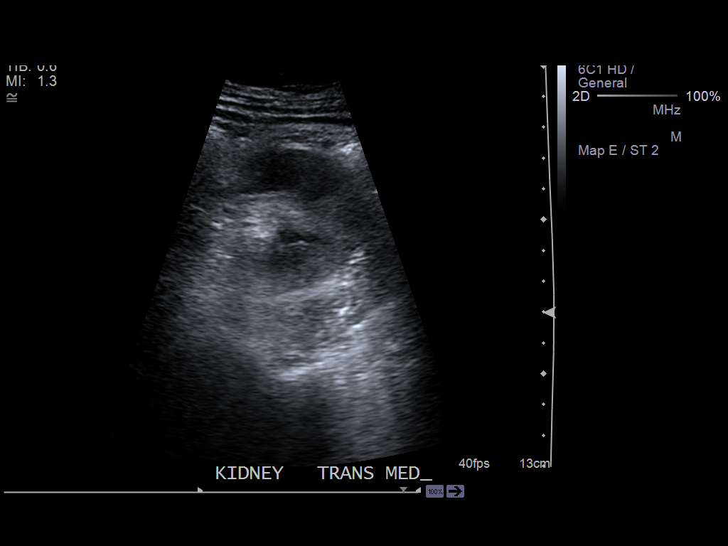
[im 29/43]
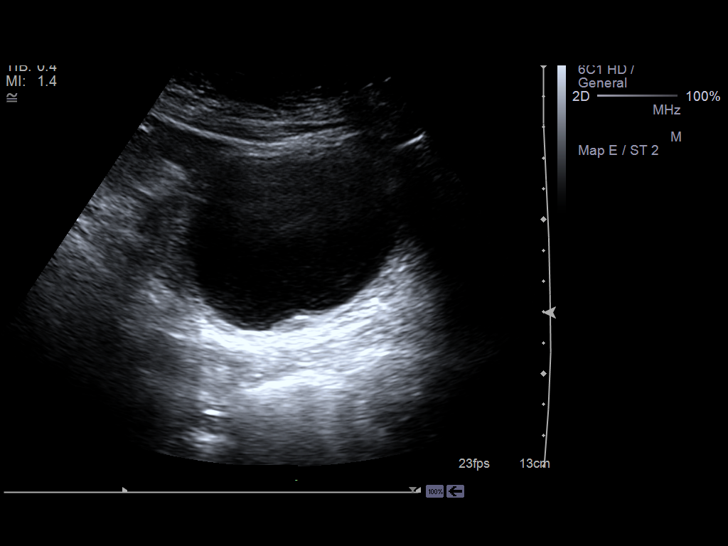
[im 32/43]
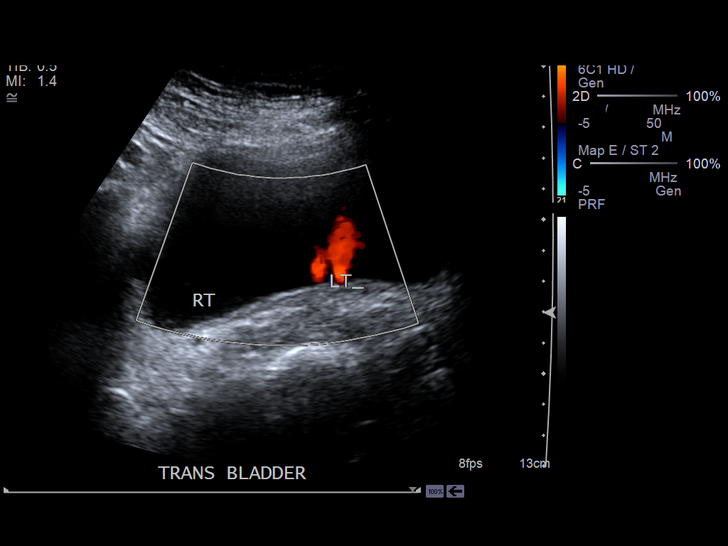
[im 36/43]
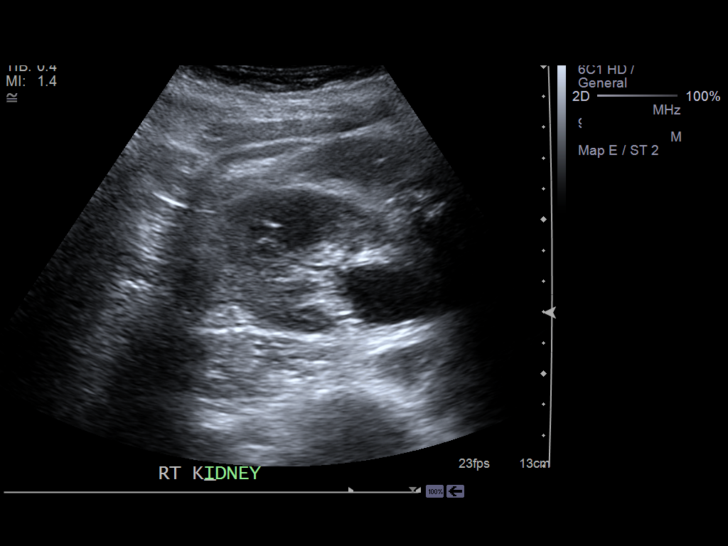
[im 39/43]
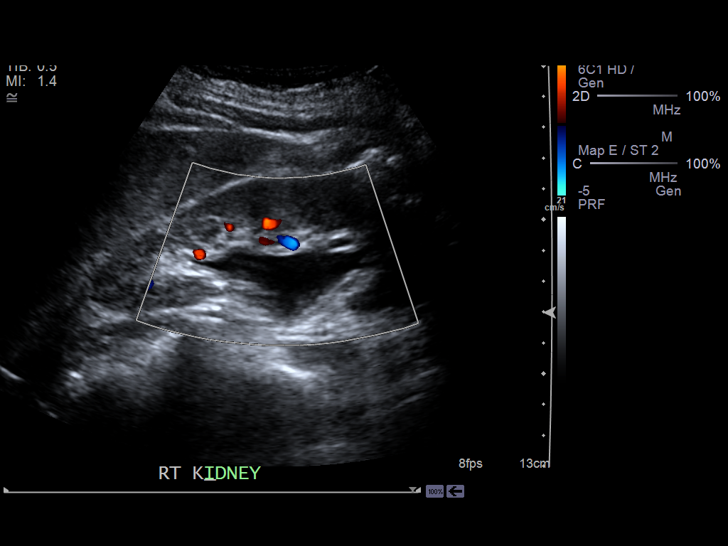
[im 43/43]
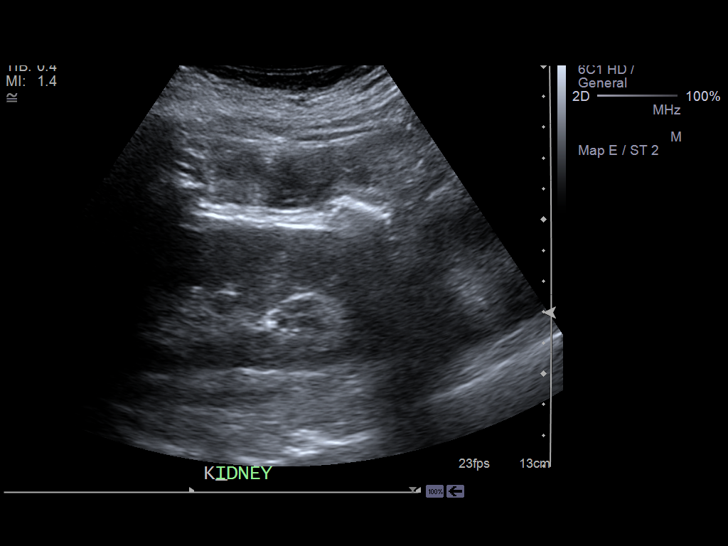

[14 of 25 positions shown; findings below may reference images not displayed]

PROCEDURE:     US  - US KIDNEY  - July 12, 2011 [DATE]

RESULT:     The right kidney measures 10.77 cm x 5.51 cm x 3.77 cm and the
left kidney measures 10.33 cm x 4.55 cm x 4.33 cm. No solid or cystic renal
mass lesions are identified. The renal cortical margins bilaterally are
smooth. No renal calcifications are identified on either side. There is
prominence of the right renal pelvis. No associated caliceal dilatation is
seen. The appearance suggests an extrarenal pelvis on the right. The
visualized portion of the urinary bladder is normal in appearance. Bilateral
ureteral flow jets are seen.
IMPRESSION: 1.  There is prominence of the right renal pelvis, likely secondary to a
partially extrarenal pelvis.
2.  Bilateral ureteral flow jets are seen.
3.  No renal calcifications are identified on either side.

[REDACTED]

## 2016-04-20 IMAGING — CR DG CHEST 2V
2 series · 2 of 2 positions shown · non-contrast
Comparison: 12/28/2004

CLINICAL DATA: Preoperative evaluation for RIGHT rotator cuff
repair, history hypertension, fibromyalgia, GERD, kidney stones

EXAM:
CHEST  2 VIEW

[w chest pa]
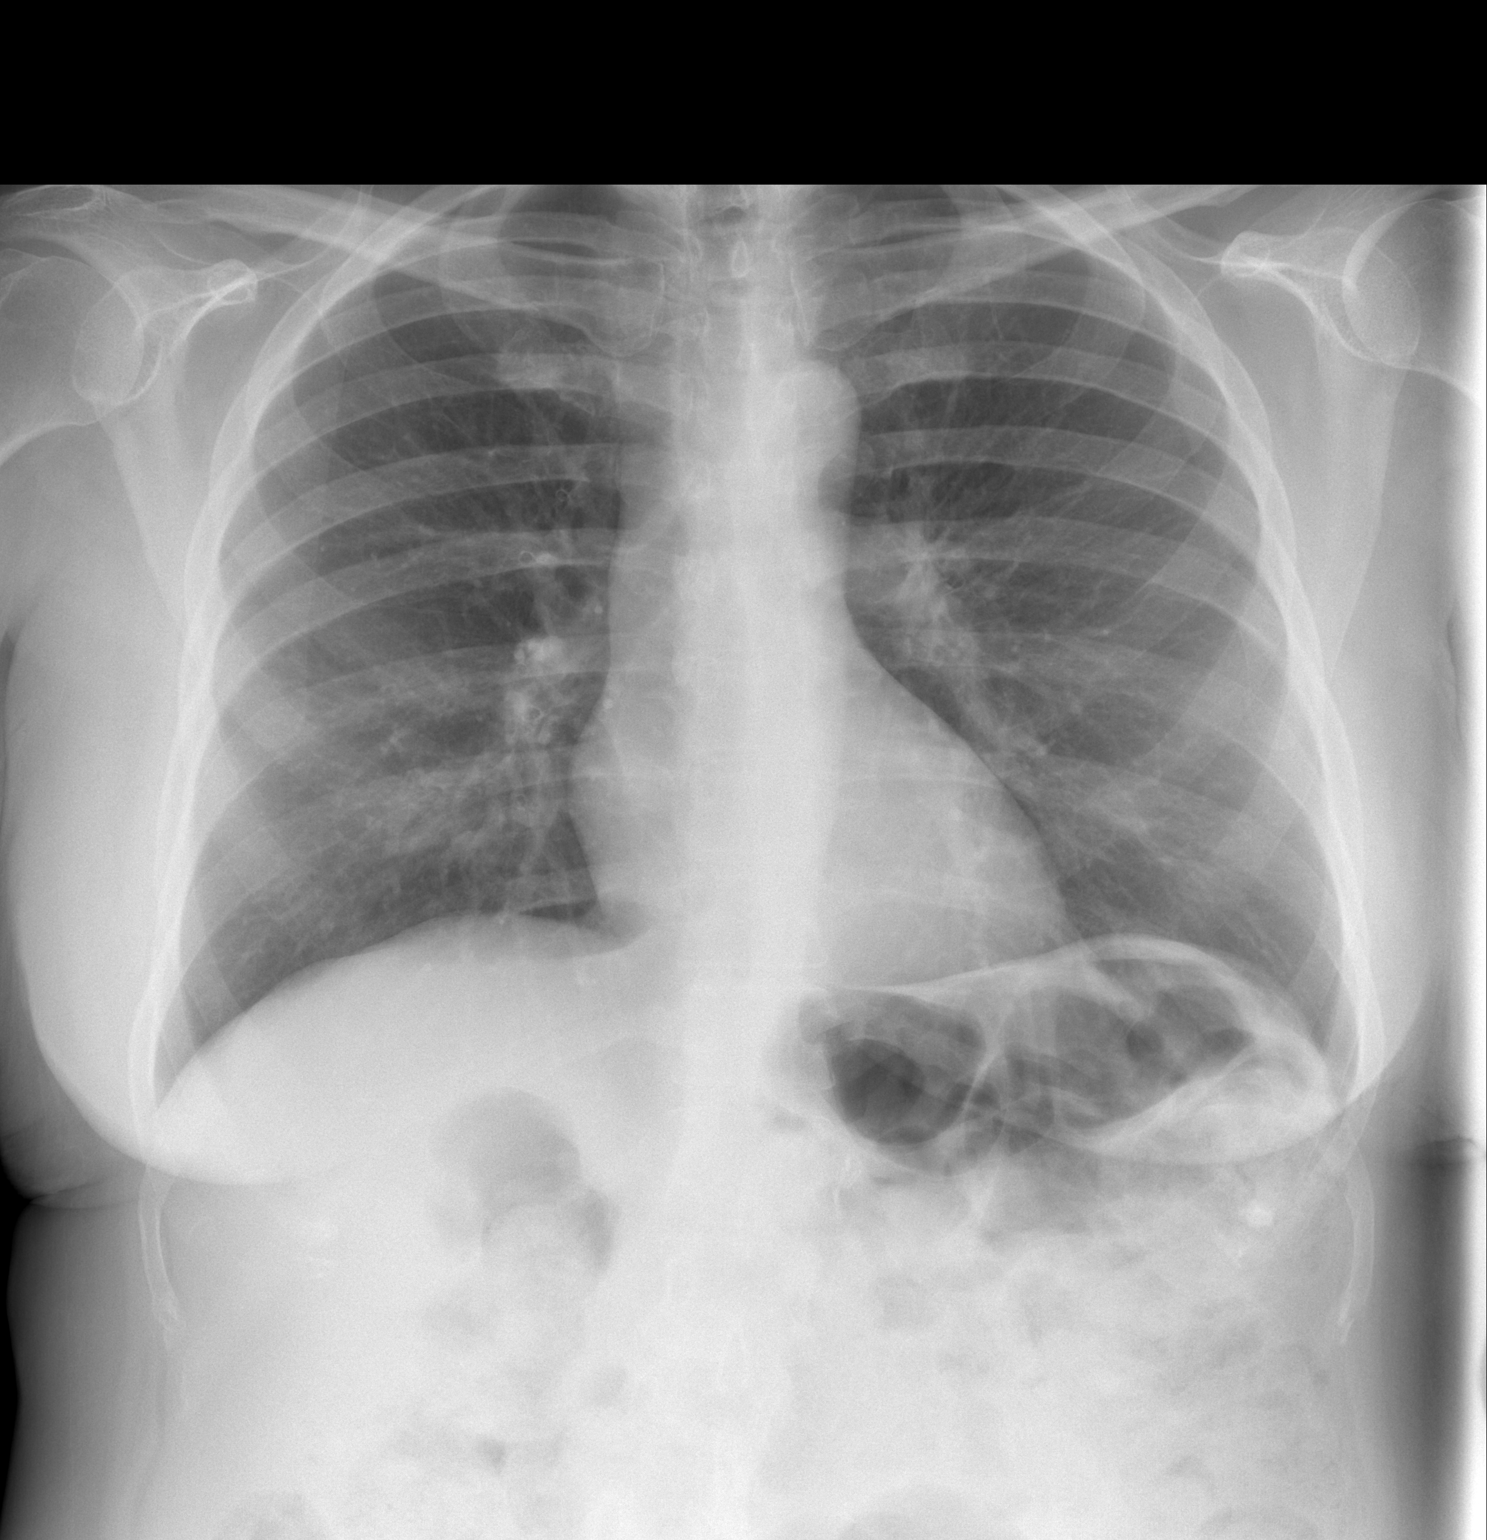

[w chest lat]
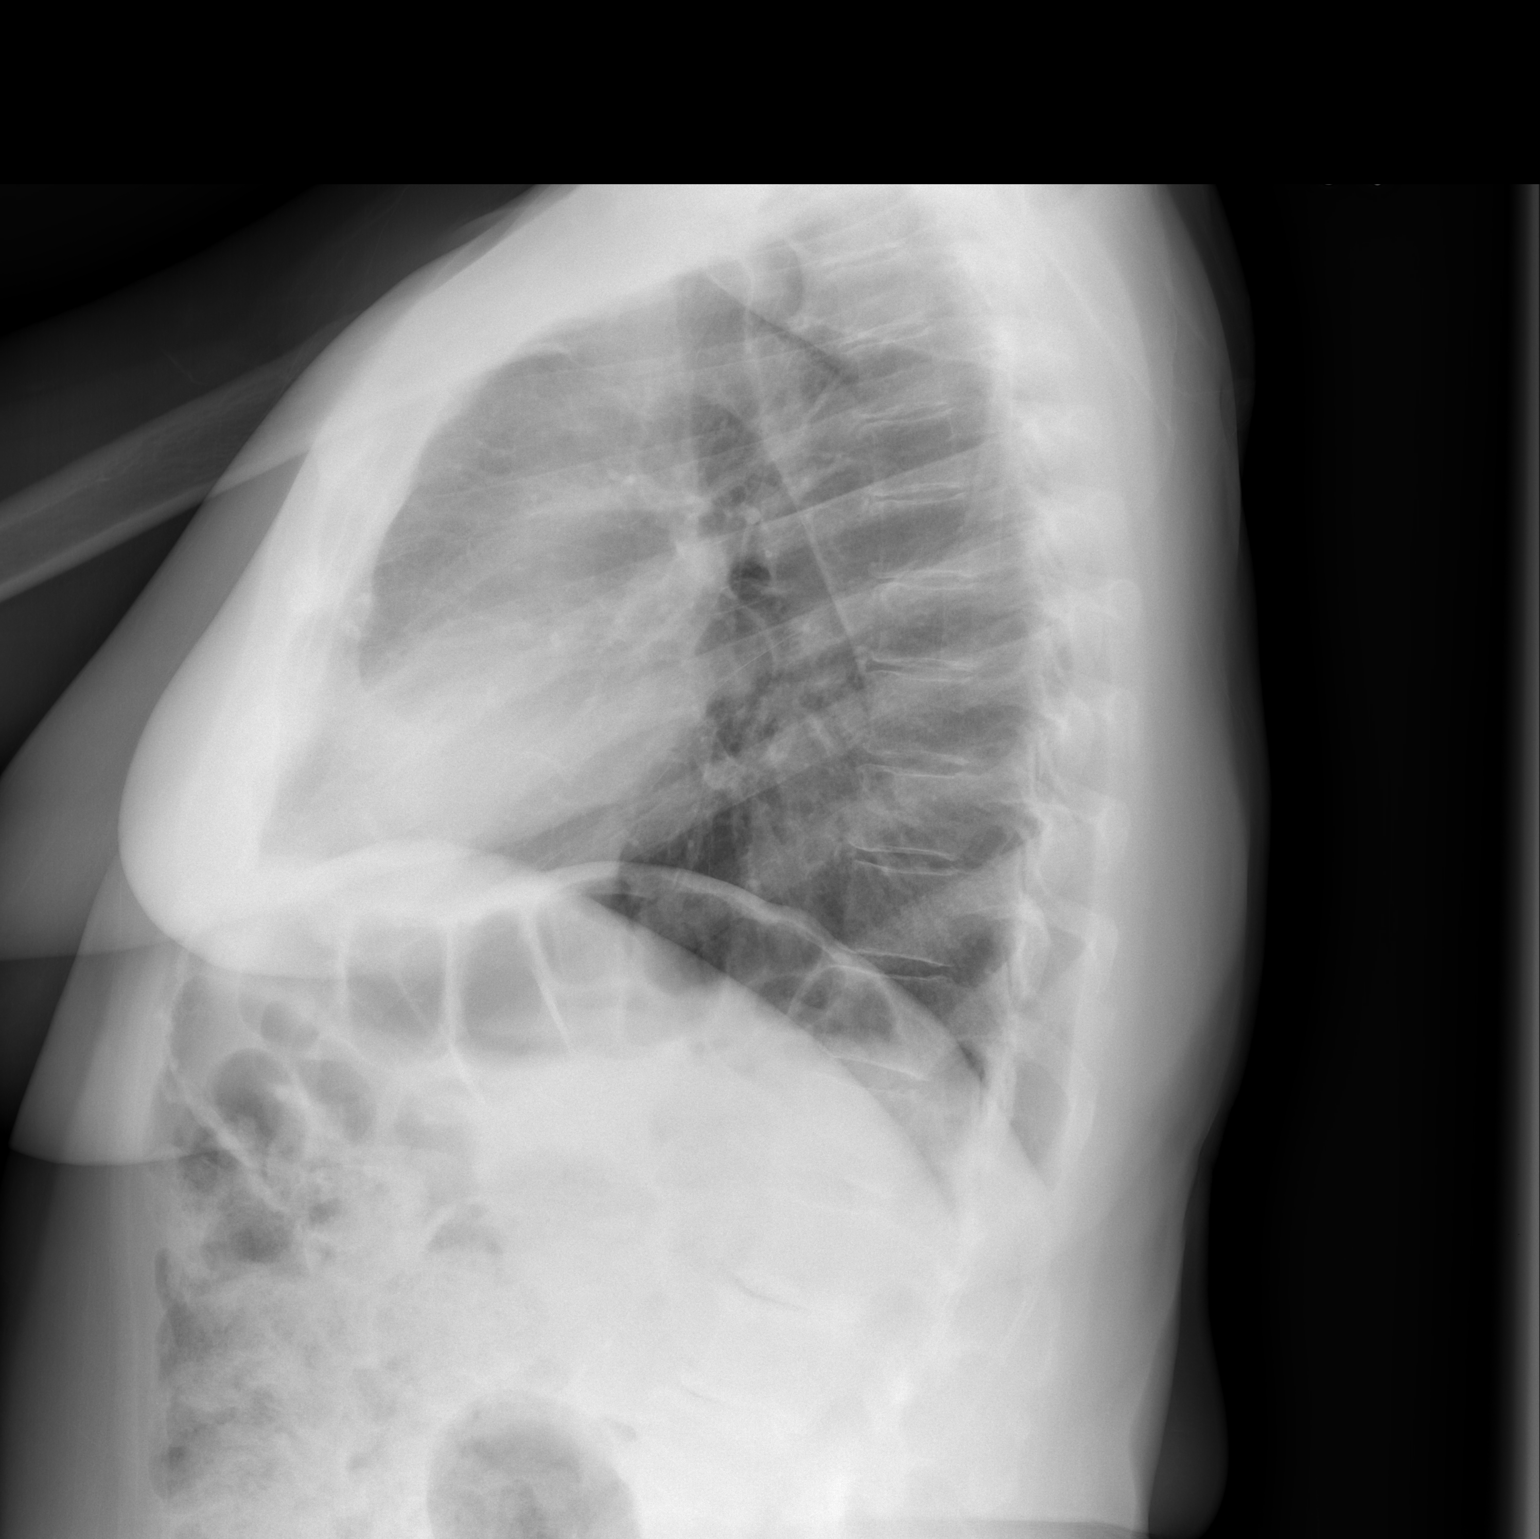

[2 of 2 positions shown; findings below may reference images not displayed]

FINDINGS: Normal heart size, mediastinal contours, and pulmonary vascularity.

Minimal chronic peribronchial thickening.

Lungs otherwise clear.

No pleural effusion or pneumothorax.

Bones unremarkable.
IMPRESSION: Minimal chronic bronchitic changes.

No acute abnormalities.

## 2020-05-04 LAB — COLOGUARD: COLOGUARD: NEGATIVE
